# Patient Record
Sex: Male | Born: 2000 | Hispanic: Yes | Marital: Single | Smoking: Never smoker
Health system: Southern US, Community
[De-identification: ages and names within clinical notes are randomized; demographics above are authoritative.]

---
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
## 2022-01-13 IMAGING — US US SCROTUM W/ DOPPLER COMPLETE
1 series · 13 of 25 positions shown · non-contrast
Comparison: None.

CLINICAL DATA: Acute left testicular pain.

EXAM:
SCROTAL ULTRASOUND
DOPPLER ULTRASOUND OF THE TESTICLES
TECHNIQUE: Complete ultrasound examination of the testicles, epididymis, and
other scrotal structures was performed. Color and spectral Doppler
ultrasound were also utilized to evaluate blood flow to the
testicles.

[Series 1: us scrotum w/ doppler complete · 0.07mm/px · 47 acquisitions, 13 frames shown]
[im 1/47]
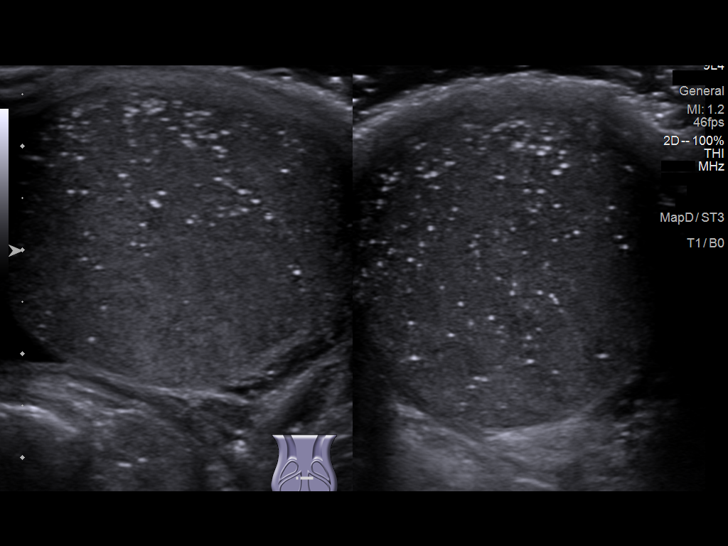
[im 4/47]
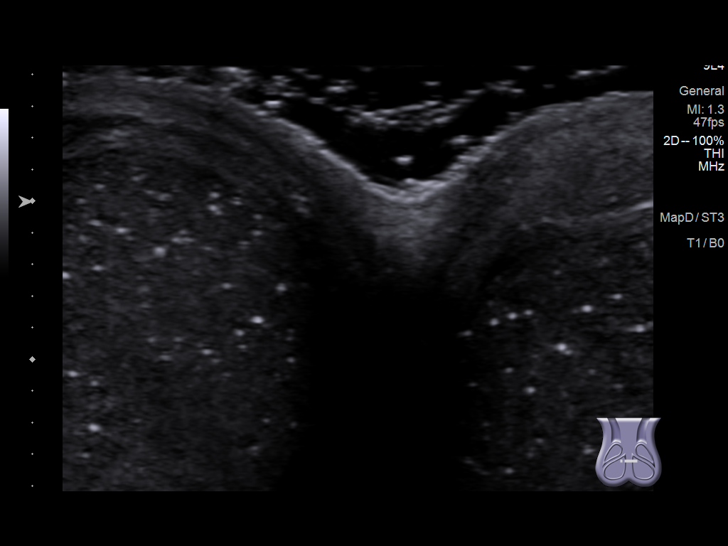
[im 8/47]
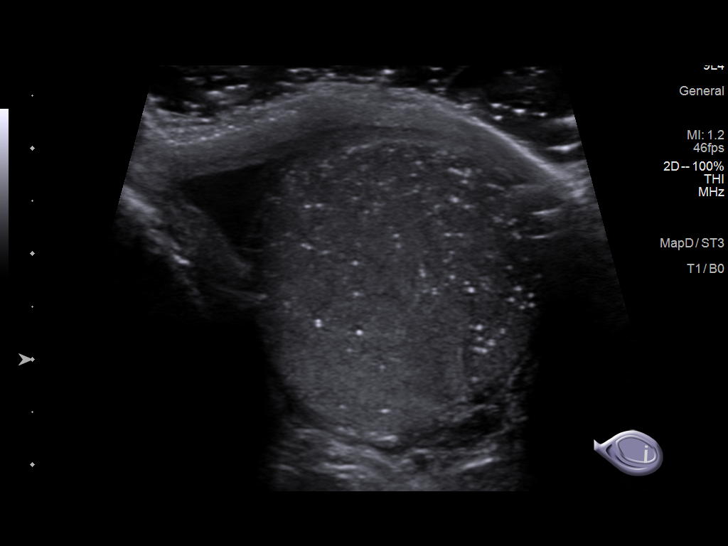
[im 12/47]
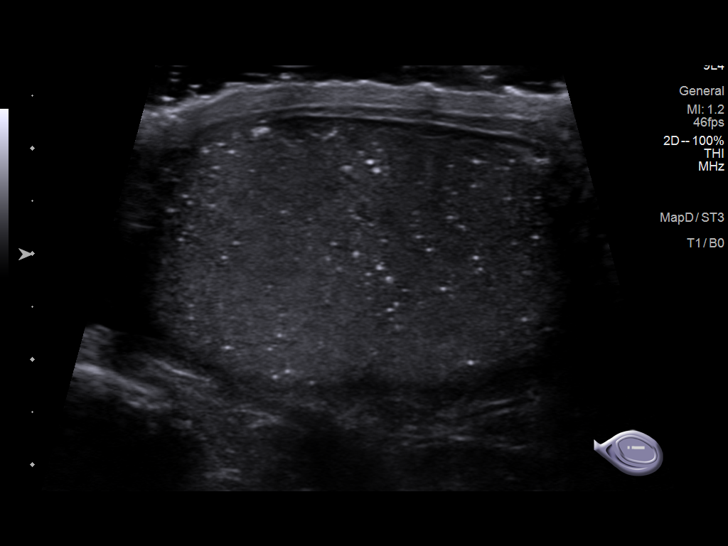
[im 16/47]
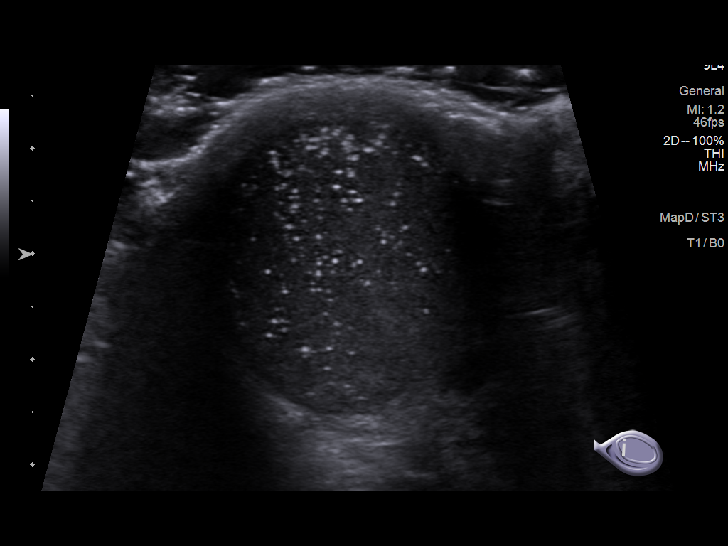
[im 20/47]
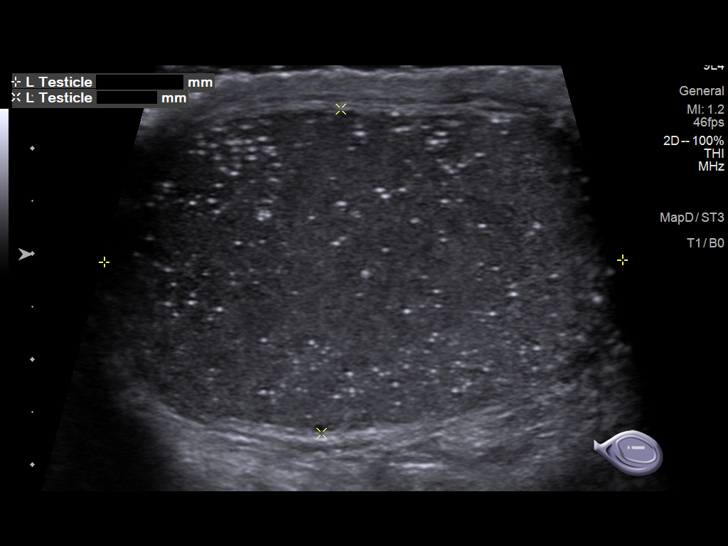
[im 24/47]
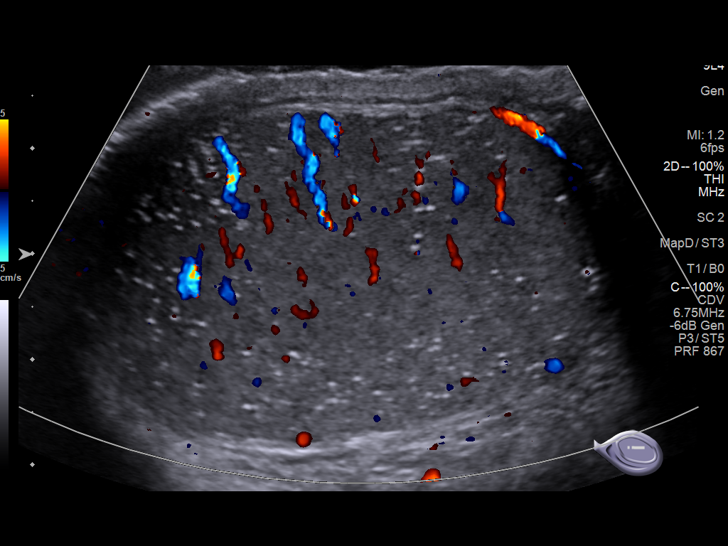
[im 27/47]
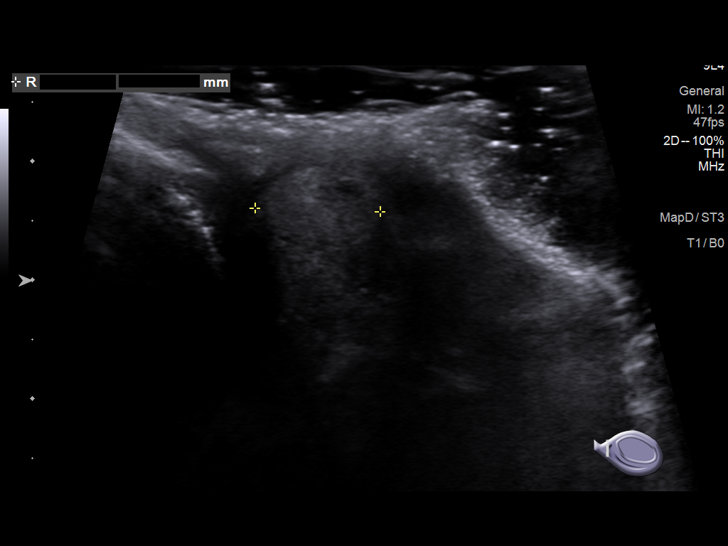
[im 31/47]
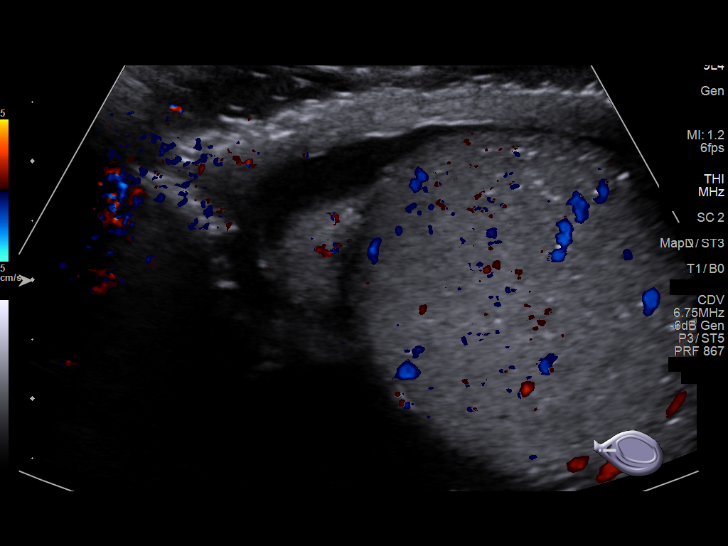
[im 35/47]
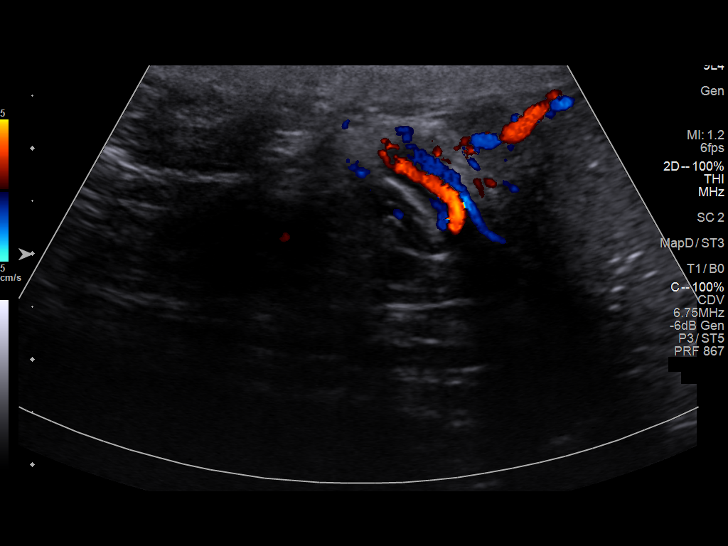
[im 39/47]
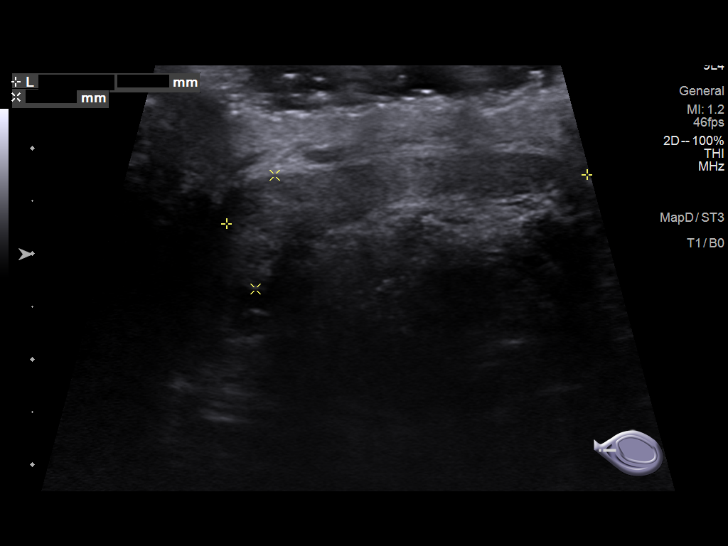
[im 43/47]
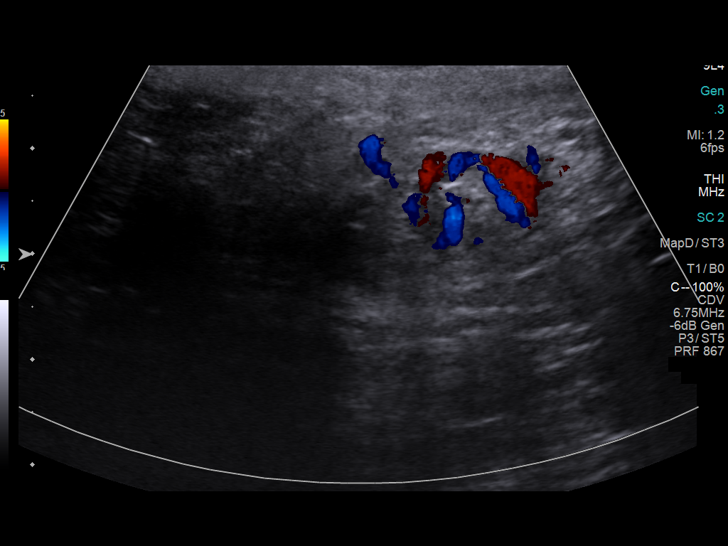
[im 47/47]
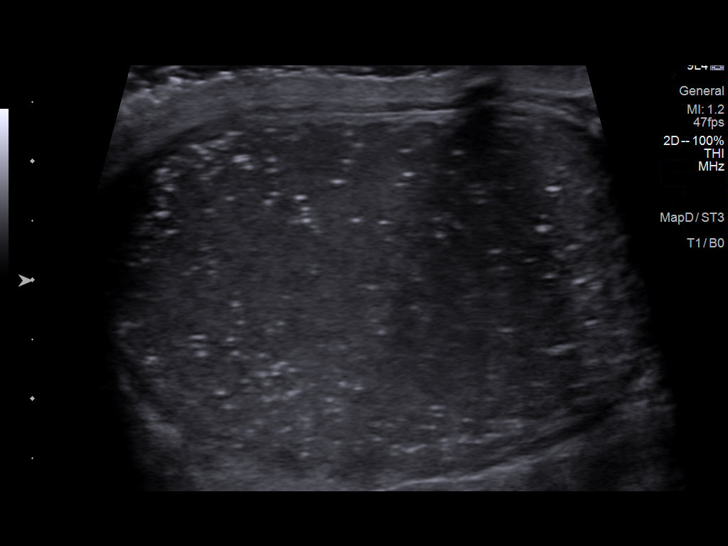

[13 of 25 positions shown; findings below may reference images not displayed]

FINDINGS: Right testicle

Measurements: 4.9 x 3.1 x 3.0 cm. No mass visualized. Multiple small
calcifications are noted consistent with microlithiasis.

Left testicle

Measurements: 4.9 x 3.4 x 3.1 cm. No mass visualized. Multiple small
calcifications are noted consistent with microlithiasis.

Right epididymis:  Normal in size and appearance.

Left epididymis:  Normal in size and appearance.

Hydrocele:  Small right hydrocele is noted.

Varicocele:  None visualized.

Pulsed Doppler interrogation of both testes demonstrates normal low
resistance arterial and venous waveforms bilaterally.
IMPRESSION: No evidence of testicular mass or torsion. Microlithiasis is noted.
Current literature suggests that testicular microlithiasis is not a
significant independent risk factor for development of testicular
carcinoma, and that follow up imaging is not warranted in the
absence of other risk factors. Monthly testicular self-examination
and annual physical exams are considered appropriate surveillance.
If patient has other risk factors for testicular carcinoma, then
referral to Urology should be considered. (Reference: Trupantsola, et
al.: A 5-Year Follow up Study of Asymptomatic Men with Testicular
Microlithiasis. J Urol 7221; 179:5230-5232.)
